# Patient Record
Sex: Female | Born: 1935 | Race: Black or African American | Hispanic: No | Smoking: Never smoker
Health system: Southern US, Community
[De-identification: ages and names within clinical notes are randomized; demographics above are authoritative.]

## PROBLEM LIST (undated history)

## (undated) DIAGNOSIS — N289 Disorder of kidney and ureter, unspecified: Secondary | ICD-10-CM

## (undated) DIAGNOSIS — E119 Type 2 diabetes mellitus without complications: Secondary | ICD-10-CM

## (undated) DIAGNOSIS — G301 Alzheimer's disease with late onset: Secondary | ICD-10-CM

## (undated) DIAGNOSIS — I10 Essential (primary) hypertension: Secondary | ICD-10-CM

## (undated) DIAGNOSIS — F0390 Unspecified dementia without behavioral disturbance: Secondary | ICD-10-CM

## (undated) DIAGNOSIS — M1990 Unspecified osteoarthritis, unspecified site: Secondary | ICD-10-CM

## (undated) DIAGNOSIS — F0281 Dementia in other diseases classified elsewhere with behavioral disturbance: Secondary | ICD-10-CM

---

## 2004-05-09 ENCOUNTER — Ambulatory Visit: Payer: Self-pay | Admitting: *Deleted

## 2005-01-10 ENCOUNTER — Ambulatory Visit: Payer: Self-pay | Admitting: Nurse Practitioner

## 2006-12-25 ENCOUNTER — Ambulatory Visit: Payer: Self-pay | Admitting: Nurse Practitioner

## 2008-04-21 ENCOUNTER — Ambulatory Visit: Payer: Self-pay | Admitting: Nephrology

## 2008-05-21 ENCOUNTER — Ambulatory Visit: Payer: Self-pay

## 2008-12-17 ENCOUNTER — Ambulatory Visit: Payer: Self-pay | Admitting: Gastroenterology

## 2011-01-17 ENCOUNTER — Ambulatory Visit: Payer: Self-pay | Admitting: Family Medicine

## 2011-10-30 ENCOUNTER — Ambulatory Visit: Payer: Self-pay | Admitting: Ophthalmology

## 2011-10-30 LAB — POTASSIUM: Potassium: 4.6 mmol/L (ref 3.5–5.1)

## 2011-11-08 ENCOUNTER — Ambulatory Visit: Payer: Self-pay | Admitting: Ophthalmology

## 2012-12-31 ENCOUNTER — Ambulatory Visit: Payer: Self-pay | Admitting: Internal Medicine

## 2013-11-07 ENCOUNTER — Ambulatory Visit: Payer: Self-pay | Admitting: Physician Assistant

## 2013-12-18 ENCOUNTER — Ambulatory Visit: Payer: Self-pay | Admitting: Emergency Medicine

## 2014-09-13 NOTE — Op Note (Signed)
PATIENT NAME:  Yam, Ambreen L MR#:  [id number] DATE OF BIRTH:  [birth year]  DATE OF PROCEDURE:  11/08/2011  PREOPERATIVE DIAGNOSES:  1. Epiretinal membrane of the left eye.  2. Retinal edema of the left eye.  3. Visually significant cataract of the left eye.   POSTOPERATIVE DIAGNOSES:  1. Epiretinal membrane of the left eye.  2. Retinal tear.  PROCEDURES PERFORMED:  1. Pars plana vitrectomy of the left eye.  2. Internal limiting membrane peel of the left eye.  3. Phacoemulsification and intraocular lens insertion of the left eye.  4. Endolaser of the left eye.   PRIMARY SURGEON: Aron Baba, MD  ANESTHESIA: Retrobulbar block of the left eye with monitored anesthesia care.   COMPLICATIONS: None.   INDICATIONS FOR PROCEDURE: This is a patient who presented to my office with decreasing vision in the left eye. Examination revealed an epiretinal membrane and associated retinal edema. The patient was also noted to have a visually significant cataract. The risks, benefits, and alternatives of the above procedure were discussed and the patient wished to proceed.   DETAILS OF PROCEDURE: After informed consent was obtained, the patient was brought to the operative suite at [facility]. The patient was placed in supine position and was given a small dose of Alfenta and a retrobulbar block was performed on the left eye by the primary surgeon without any complications. The left eye was prepped and draped in sterile manner. After lid speculum was inserted, a side-port wound was created at approximately 10:30 in a clear corneal fashion. DisCoVisc was injected into the anterior chamber to maintain it. A keratome blade was used to create a main corneal wound at approximately 12 o'clock. A cystotome was introduced and a continuous 360 degree anterior capsulorrhexis was created without complications. The lens was hydrodissected using BSS on a 26-gauge cannula. The lens was rotated  for 90 degrees. Phacoemulsification wand was introduced and the lens was broken into four quadrants and removed without complication. The remnant cortical material was removed using INA. DisCoVisc was injected into the capsular bag. A 19-diopter Tecnis ZCB00 lens, serial #[id number] was injected into the capsular bag and rotated into position. The DisCoVisc was removed using INA. The wounds were hydrated and noted to be watertight. Attention was turned to the pars plana vitrectomy portion of the case.   A 25-gauge trocar was placed inferotemporally through displaced conjunctiva in an oblique fashion 3 mm beyond the limbus. The infusion cannula was turned on and inserted through the trocar and secured into position with Steri-Strips. Two more trocars were placed in a similar fashion superotemporally and superonasally. Vitreous cutter and light pipe were introduced in the eye and a core vitrectomy was performed. Vitreous base was confirmed as elevated and the vitreous was trimmed for 360 degrees. Indocyanine green was injected onto the posterior pole and removed within 30 seconds. An epiretinal membrane and internal limiting membrane peel were performed for 360 degrees around the fovea without complication. Scleral depressed exam was then performed and two small breaks were identified at approximately 12 o'clock. Complete air-fluid exchange was performed and endolaser was utilized as a focal wall off to allow four rows of laser around the tears out to the ora serrata. No other breaks or tears could be identified for 360 degrees prior to the air-fluid exchange. The trocars were then removed and the wounds were noted to be airtight. 5 mg of dexamethasone was given into the inferior fornix and the pressure in the eye  was confirmed to be approximately 15 mmHg. 5 mg of dexamethasone was given and the lid speculum was removed. The eye was cleaned and TobraDex was placed in the eye. A patch and shield were placed over the  eye and the patient was transferred to postanesthesia care with instructions to remain head up.  ____________________________ Ignacia FellingMatthew F. Champ MungoAppenzeller, MD mfa:slb D: 11/08/2011 09:52:25 ET    T: 11/08/2011 10:48:08 ET       JOB#: [id number] cc: Ignacia FellingMatthew F. Champ MungoAppenzeller, MD, <Dictator> Cline CoolsMATTHEW F Kirstie Larsen MD ELECTRONICALLY SIGNED 11/15/2011 7:07

## 2015-12-06 ENCOUNTER — Other Ambulatory Visit: Payer: Self-pay | Admitting: Internal Medicine

## 2015-12-06 DIAGNOSIS — Z13820 Encounter for screening for osteoporosis: Secondary | ICD-10-CM

## 2016-07-19 ENCOUNTER — Ambulatory Visit
Admission: RE | Admit: 2016-07-19 | Payer: Medicare Other | Source: Ambulatory Visit | Admitting: Unknown Physician Specialty

## 2016-07-19 ENCOUNTER — Encounter: Admission: RE | Payer: Self-pay | Source: Ambulatory Visit

## 2016-07-19 SURGERY — COLONOSCOPY WITH PROPOFOL
Anesthesia: General

## 2016-10-04 ENCOUNTER — Encounter: Admission: RE | Payer: Self-pay | Source: Ambulatory Visit

## 2016-10-04 ENCOUNTER — Ambulatory Visit
Admission: RE | Admit: 2016-10-04 | Payer: Medicare Other | Source: Ambulatory Visit | Admitting: Unknown Physician Specialty

## 2016-10-04 SURGERY — COLONOSCOPY WITH PROPOFOL
Anesthesia: General

## 2019-05-27 ENCOUNTER — Emergency Department: Payer: Medicare Other

## 2019-05-27 ENCOUNTER — Emergency Department
Admission: EM | Admit: 2019-05-27 | Discharge: 2019-05-27 | Disposition: A | Discharge status: Home / Self Care | Payer: Medicare Other | Attending: Emergency Medicine | Admitting: Emergency Medicine

## 2019-05-27 DIAGNOSIS — S098XXA Other specified injuries of head, initial encounter: Secondary | ICD-10-CM | POA: Insufficient documentation

## 2019-05-27 DIAGNOSIS — Y999 Unspecified external cause status: Secondary | ICD-10-CM | POA: Diagnosis not present

## 2019-05-27 DIAGNOSIS — Y939 Activity, unspecified: Secondary | ICD-10-CM | POA: Diagnosis not present

## 2019-05-27 DIAGNOSIS — G301 Alzheimer's disease with late onset: Secondary | ICD-10-CM | POA: Insufficient documentation

## 2019-05-27 DIAGNOSIS — E119 Type 2 diabetes mellitus without complications: Secondary | ICD-10-CM | POA: Diagnosis not present

## 2019-05-27 DIAGNOSIS — W19XXXA Unspecified fall, initial encounter: Secondary | ICD-10-CM | POA: Insufficient documentation

## 2019-05-27 DIAGNOSIS — S0181XA Laceration without foreign body of other part of head, initial encounter: Secondary | ICD-10-CM | POA: Insufficient documentation

## 2019-05-27 DIAGNOSIS — Y92129 Unspecified place in nursing home as the place of occurrence of the external cause: Secondary | ICD-10-CM | POA: Insufficient documentation

## 2019-05-27 DIAGNOSIS — I10 Essential (primary) hypertension: Secondary | ICD-10-CM | POA: Insufficient documentation

## 2019-05-27 DIAGNOSIS — S0990XA Unspecified injury of head, initial encounter: Secondary | ICD-10-CM

## 2019-05-27 DIAGNOSIS — F0281 Dementia in other diseases classified elsewhere with behavioral disturbance: Secondary | ICD-10-CM | POA: Insufficient documentation

## 2019-05-27 HISTORY — DX: Unspecified dementia, unspecified severity, without behavioral disturbance, psychotic disturbance, mood disturbance, and anxiety: F03.90

## 2019-05-27 HISTORY — DX: Disorder of kidney and ureter, unspecified: N28.9

## 2019-05-27 HISTORY — DX: Type 2 diabetes mellitus without complications: E11.9

## 2019-05-27 HISTORY — DX: Unspecified osteoarthritis, unspecified site: M19.90

## 2019-05-27 HISTORY — DX: Dementia in other diseases classified elsewhere with behavioral disturbance: F02.81

## 2019-05-27 HISTORY — DX: Alzheimer's disease with late onset: G30.1

## 2019-05-27 HISTORY — DX: Essential (primary) hypertension: I10

## 2019-05-27 MED ORDER — LIDOCAINE-EPINEPHRINE-TETRACAINE (LET) TOPICAL GEL
3.0000 mL | Freq: Once | TOPICAL | Status: AC
  Administered 2019-05-27: 08:00:00 3 mL via TOPICAL

## 2019-05-27 MED ORDER — LIDOCAINE HCL (PF) 1 % IJ SOLN
10.0000 mL | Freq: Once | INTRAMUSCULAR | Status: AC
  Administered 2019-05-27: 10 mL
  Filled 2019-05-27: qty 10

## 2019-05-27 NOTE — ED Provider Notes (Signed)
[facility] Emergency Department Provider Note  Time seen: 7:44 AM  I have reviewed the triage vital signs and the nursing notes.   HISTORY  Chief Complaint Fall   HPI Lori Francis is a 84 y.o. female with a past medical history of dementia, diabetes, hypertension presents to the emergency department after a fall.  According to report patient fell at her nursing home this morning, patient has a substantial laceration to her forehead.  Patient does not recall the fall, cannot tell me why she is here but has a reported history of dementia.  Has no complaints, denies any pain.   Past Medical History:  Diagnosis Date  . Alzheimer's dementia, late onset, with behavioral disturbance ([place])   . Dementia ([place])   . Diabetes mellitus without complication ([place])   . Hypertension   . Osteoarthritis   . Renal disorder     There are no problems to display for this patient.   History reviewed. No pertinent surgical history.  Prior to Admission medications   Not on File    Not on File  History reviewed. No pertinent family history.  Social History Social History   Tobacco Use  . Smoking status: Never Smoker  . Smokeless tobacco: Never Used  Substance Use Topics  . Alcohol use: Never  . Drug use: Never    Review of Systems Unable to obtain adequate/accurate review of systems secondary to baseline dementia  ____________________________________________   PHYSICAL EXAM:  VITAL SIGNS: ED Triage Vitals  Enc Vitals Group     BP 05/27/19 0650 (!) 166/77     Pulse Rate 05/27/19 0650 80     Resp 05/27/19 0650 16     Temp 05/27/19 0650 98.5 F (36.9 C)     Temp Source 05/27/19 0650 Oral     SpO2 05/27/19 0647 99 %     Weight 05/27/19 0652 130 lb (59 kg)     Height 05/27/19 0652 5\' 1"  (1.549 m)     Head Circumference --      Peak Flow --      Pain Score 05/27/19 0651 3     Pain Loc --      Pain Edu? --      Excl. in [place]? --     Constitutional: Patient is awake and alert, no distress.  Calm and cooperative. Eyes: Normal exam ENT      Head: Patient has a stellate laceration to the forehead measuring approximately 6 cm in diameter      Mouth/Throat: Mucous membranes are moist. Cardiovascular: Normal rate, regular rhythm.  Respiratory: Normal respiratory effort without tachypnea nor retractions. Breath sounds are clear Gastrointestinal: Soft and nontender. No distention.   Musculoskeletal: Nontender with normal range of motion in Francis extremities Neurologic:  Normal speech and language. No gross focal neurologic deficits  Skin: Stellate forehead laceration Psychiatric: Mood and affect are normal.  ____________________________________________   RADIOLOGY  CT of the head and neck are negative for acute injury.  ____________________________________________   INITIAL IMPRESSION / ASSESSMENT AND PLAN / ED COURSE  Pertinent labs & imaging results that were available during my care of the patient were reviewed by me and considered in my medical decision making (see chart for details).   Patient presents to the emergency department after a fall at her nursing facility with a stellate forehead laceration.  Overall patient appears well, alert, no distress.  Moves Francis extremities well without pain elicited.  We will numb the patient's  forehead laceration and repair with sutures.  We will also obtain CT imaging of the head and C-spine as precaution given the laceration.  Stellate laceration repaired with 7 sutures.  Patient will be discharged back to her nursing facility.  LACERATION REPAIR Performed by: Minna Antis Authorized by: Minna Antis Consent: Verbal consent obtained. Risks and benefits: risks, benefits and alternatives were discussed Consent given by: patient Patient identity confirmed: provided demographic data Prepped and Draped in normal sterile fashion Wound explored  Laceration  Location: Forehead  Laceration Length: 6 cm, stellate  No Foreign Bodies seen or palpated  Anesthesia: local infiltration as well as topical  Local anesthetic: lidocaine 1% without epinephrine  Anesthetic total: 7 ml  Irrigation method: syringe Amount of cleaning: standard  Skin closure: 5-0 nylon  Number of sutures: 7  Technique: Simple interrupted  Patient tolerance: Patient tolerated the procedure well with no immediate complications.   Gwendalynn Eckstrom was evaluated in Emergency Department on 05/27/2019 for the symptoms described in the history of present illness. She was evaluated in the context of the global COVID-19 pandemic, which necessitated consideration that the patient might be at risk for infection with the SARS-CoV-2 virus that causes COVID-19. Institutional protocols and algorithms that pertain to the evaluation of patients at risk for COVID-19 are in a state of rapid change based on information released by regulatory bodies including the CDC and federal and state organizations. These policies and algorithms were followed during the patient's care in the ED.  ____________________________________________   FINAL CLINICAL IMPRESSION(S) / ED DIAGNOSES  Fall Head injury Laceration   Minna Antis, MD 05/27/19 [phone]

## 2019-05-27 NOTE — ED Notes (Signed)
wound on pt forehead cleaned and covered with non adherent gauze and taped down using paper tape.

## 2019-05-27 NOTE — ED Triage Notes (Signed)
Patient is from [facility] facility. Patient arriving by [facility] for unwitnessed fall with a LAC to forehead. Patient denies LOC. Forehead is bandaged with gauze. Bleeding controlled at this time.

## 2019-05-27 NOTE — Discharge Instructions (Addendum)
You have been seen in the emergency department after a fall.  Your work-up including CT scans of the head have resulted normal.  Your laceration was repaired with sutures that will need to be removed in 7 days.  Please follow-up with your doctor in 7 days for suture removal.  Return to the emergency department for any symptoms personally concerning to yourself or staff members.

## 2019-05-27 NOTE — ED Notes (Signed)
[facility]  CALLED  FOR  TRANSPORT 

## 2019-06-03 ENCOUNTER — Other Ambulatory Visit: Payer: Self-pay

## 2019-06-03 ENCOUNTER — Emergency Department: Payer: Medicare Other

## 2019-06-03 ENCOUNTER — Emergency Department
Admission: EM | Admit: 2019-06-03 | Discharge: 2019-06-03 | Disposition: A | Discharge status: Home / Self Care | Payer: Medicare Other | Attending: Emergency Medicine | Admitting: Emergency Medicine

## 2019-06-03 DIAGNOSIS — Y92122 Bedroom in nursing home as the place of occurrence of the external cause: Secondary | ICD-10-CM | POA: Insufficient documentation

## 2019-06-03 DIAGNOSIS — E119 Type 2 diabetes mellitus without complications: Secondary | ICD-10-CM | POA: Insufficient documentation

## 2019-06-03 DIAGNOSIS — N3001 Acute cystitis with hematuria: Secondary | ICD-10-CM

## 2019-06-03 DIAGNOSIS — G309 Alzheimer's disease, unspecified: Secondary | ICD-10-CM | POA: Insufficient documentation

## 2019-06-03 DIAGNOSIS — Z23 Encounter for immunization: Secondary | ICD-10-CM | POA: Insufficient documentation

## 2019-06-03 DIAGNOSIS — Y939 Activity, unspecified: Secondary | ICD-10-CM | POA: Insufficient documentation

## 2019-06-03 DIAGNOSIS — I10 Essential (primary) hypertension: Secondary | ICD-10-CM | POA: Diagnosis not present

## 2019-06-03 DIAGNOSIS — S0990XA Unspecified injury of head, initial encounter: Secondary | ICD-10-CM

## 2019-06-03 DIAGNOSIS — Y998 Other external cause status: Secondary | ICD-10-CM | POA: Insufficient documentation

## 2019-06-03 DIAGNOSIS — W19XXXA Unspecified fall, initial encounter: Secondary | ICD-10-CM

## 2019-06-03 LAB — URINALYSIS, COMPLETE (UACMP) WITH MICROSCOPIC
Bilirubin Urine: NEGATIVE
Glucose, UA: NEGATIVE mg/dL
Hgb urine dipstick: NEGATIVE
Ketones, ur: NEGATIVE mg/dL
Nitrite: POSITIVE — AB
Protein, ur: 30 mg/dL — AB
Specific Gravity, Urine: 1.011 (ref 1.005–1.030)
WBC, UA: >50 WBC/hpf — ABNORMAL HIGH (ref 0–5)
pH: 6 (ref 5.0–8.0)

## 2019-06-03 LAB — CBC WITH DIFFERENTIAL/PLATELET
Abs Immature Granulocytes: 0.03 10*3/uL (ref 0.00–0.07)
Basophils Absolute: 0.1 10*3/uL (ref 0.0–0.1)
Basophils Relative: 1 %
Eosinophils Absolute: 0.3 10*3/uL (ref 0.0–0.5)
Eosinophils Relative: 3 %
HCT: 29.7 % — ABNORMAL LOW (ref 36.0–46.0)
Hemoglobin: 9.6 g/dL — ABNORMAL LOW (ref 12.0–15.0)
Immature Granulocytes: 0 %
Lymphocytes Relative: 32 %
Lymphs Abs: 3 10*3/uL (ref 0.7–4.0)
MCH: 31.3 pg (ref 26.0–34.0)
MCHC: 32.3 g/dL (ref 30.0–36.0)
MCV: 96.7 fL (ref 80.0–100.0)
Monocytes Absolute: 0.8 10*3/uL (ref 0.1–1.0)
Monocytes Relative: 9 %
Neutro Abs: 5.3 10*3/uL (ref 1.7–7.7)
Neutrophils Relative %: 55 %
Platelets: 357 10*3/uL (ref 150–400)
RBC: 3.07 MIL/uL — ABNORMAL LOW (ref 3.87–5.11)
RDW: 14.1 % (ref 11.5–15.5)
WBC: 9.4 10*3/uL (ref 4.0–10.5)
nRBC: 0 % (ref 0.0–0.2)

## 2019-06-03 LAB — COMPREHENSIVE METABOLIC PANEL
ALT: 9 U/L (ref 0–44)
AST: 17 U/L (ref 15–41)
Albumin: 3.4 g/dL — ABNORMAL LOW (ref 3.5–5.0)
Alkaline Phosphatase: 74 U/L (ref 38–126)
Anion gap: 8 (ref 5–15)
BUN: 41 mg/dL — ABNORMAL HIGH (ref 8–23)
CO2: 27 mmol/L (ref 22–32)
Calcium: 9.1 mg/dL (ref 8.9–10.3)
Chloride: 107 mmol/L (ref 98–111)
Creatinine, Ser: 1.83 mg/dL — ABNORMAL HIGH (ref 0.44–1.00)
GFR calc Af Amer: 29 mL/min — ABNORMAL LOW (ref >60)
GFR calc non Af Amer: 25 mL/min — ABNORMAL LOW (ref >60)
Glucose, Bld: 106 mg/dL — ABNORMAL HIGH (ref 70–99)
Potassium: 5.3 mmol/L — ABNORMAL HIGH (ref 3.5–5.1)
Sodium: 142 mmol/L (ref 135–145)
Total Bilirubin: 0.7 mg/dL (ref 0.3–1.2)
Total Protein: 7.2 g/dL (ref 6.5–8.1)

## 2019-06-03 MED ORDER — ACETAMINOPHEN 500 MG PO TABS
1000.0000 mg | ORAL_TABLET | Freq: Once | ORAL | Status: AC
  Administered 2019-06-03: 1000 mg via ORAL
  Filled 2019-06-03: qty 2

## 2019-06-03 MED ORDER — TETANUS-DIPHTH-ACELL PERTUSSIS 5-2.5-18.5 LF-MCG/0.5 IM SUSP
0.5000 mL | Freq: Once | INTRAMUSCULAR | Status: AC
  Administered 2019-06-03: 07:00:00 0.5 mL via INTRAMUSCULAR
  Filled 2019-06-03: qty 0.5

## 2019-06-03 MED ORDER — LACTATED RINGERS IV BOLUS: 1000.0000 mL | Freq: Once | INTRAVENOUS | Status: DC

## 2019-06-03 MED ORDER — SODIUM CHLORIDE 0.9 % IV BOLUS
1000.0000 mL | Freq: Once | INTRAVENOUS | Status: AC
  Administered 2019-06-03: 1000 mL via INTRAVENOUS

## 2019-06-03 MED ORDER — SODIUM CHLORIDE 0.9 % IV SOLN
1.0000 g | Freq: Once | INTRAVENOUS | Status: AC
  Administered 2019-06-03: 1 g via INTRAVENOUS
  Filled 2019-06-03: qty 10

## 2019-06-03 MED ORDER — CEPHALEXIN 500 MG PO CAPS: 500.0000 mg | ORAL_CAPSULE | Freq: Three times a day (TID) | ORAL | 0 refills | Status: AC

## 2019-06-03 NOTE — ED Notes (Signed)
Pt unable to sign r/t dementia. °

## 2019-06-03 NOTE — ED Provider Notes (Signed)
-----------------------------------------   6:59 AM on 06/03/2019 -----------------------------------------  Blood pressure 110/70, pulse 65, temperature 98.4 F (36.9 C), temperature source Oral, resp. rate 18, height 5\' 1"  (1.549 m), weight 58.9 kg, SpO2 98 %.  Assuming care from Dr. .  In short, Lori Francis is a 84 y.o. female with a chief complaint of Fall and Laceration (forehead) .  Refer to the original H&P for additional details.  The current plan of care is to follow-up labs and UA, plan to d/c back to facility with treatment for UTI.  BMP shows chronic kidney disease comparable to patient's baseline per prior lab work from [facility].  She does appear to be slightly dehydrated and was hydrated with IV fluids, also completed dose of IV antibiotics without issue.  Vital signs are not consistent with sepsis and patient remains appropriate for discharge back to nursing facility per original plan.    [facility], MD 06/03/19 [phone]

## 2019-06-03 NOTE — ED Provider Notes (Signed)
[facility] Emergency Department Provider Note  ____________________________________________  Time seen: Approximately 5:10 AM  I have reviewed the triage vital signs and the nursing notes.   HISTORY  Chief Complaint Fall and Laceration (forehead)   HPI Lori Francis is a 84 y.o. female  with a history of Alzheimer's dementia, diabetes, hypertension, osteoarthritis  who presents for evaluation of a unwitnessed fall.  Patient arrives from her nursing home after being on the floor in her room.  Patient was at baseline, alert, and aware of having fallen.  She is complaining of mild headache.  Patient has a laceration that was repaired from a fall 7 days ago and her forehead and he seems like some of the stitches have come lose.  She has mild bleeding from the site.  She is not on blood thinners.  Patient denies neck pain or back pain, hip pain, extremity pain, chest pain, abdominal pain    Past Medical History:  Diagnosis Date  . Alzheimer's dementia, late onset, with behavioral disturbance ([facility])   . Dementia ([facility])   . Diabetes mellitus without complication ([facility])   . Hypertension   . Osteoarthritis   . Renal disorder     There are no problems to display for this patient.   History reviewed. No pertinent surgical history.  Prior to Admission medications   Not on File    Allergies Patient has no allergy information on record.  History reviewed. No pertinent family history.  Social History Social History   Tobacco Use  . Smoking status: Never Smoker  . Smokeless tobacco: Never Used  Substance Use Topics  . Alcohol use: Never  . Drug use: Never    Review of Systems  Constitutional: Negative for fever. Eyes: Negative for visual changes. ENT: Negative for facial injury or neck injury Cardiovascular: Negative for chest injury. Respiratory: Negative for shortness of breath. Negative for chest wall injury. Gastrointestinal: Negative  for abdominal pain or injury. Genitourinary: Negative for dysuria. Musculoskeletal: Negative for back injury, negative for arm or leg pain. Skin: Negative for laceration/abrasions. Neurological: + head injury.   ____________________________________________   PHYSICAL EXAM:  VITAL SIGNS: Vitals:   06/03/19 0510  BP: 110/70  Pulse: 65  Resp: 18  Temp: 98.4 F (36.9 C)  SpO2: 98%    Full spinal precautions maintained throughout the trauma exam. Constitutional: Alert and oriented x 2. No acute distress. Does not appear intoxicated. HEENT Head: Normocephalic and atraumatic. Face: No facial bony tenderness. Stable midface. Previous forehead laceration with small amount of blood Ears: No hemotympanum bilaterally. No Battle sign Eyes: No eye injury. PERRL. No raccoon eyes Nose: Nontender. No epistaxis. No rhinorrhea Mouth/Throat: Mucous membranes are moist. No oropharyngeal blood. No dental injury. Airway patent without stridor. Normal voice. Neck: no C-collar. No midline c-spine tenderness.  Cardiovascular: Normal rate, regular rhythm. Normal and symmetric distal pulses are present in all extremities. Pulmonary/Chest: Chest wall is stable and nontender to palpation/compression. Normal respiratory effort. Breath sounds are normal. No crepitus.  Abdominal: Soft, nontender, non distended. Musculoskeletal: Nontender with normal full range of motion in all extremities. No deformities. No thoracic or lumbar midline spinal tenderness. Pelvis is stable. Skin: Skin is warm, dry and intact. No abrasions or contutions. Psychiatric: Speech and behavior are appropriate. Neurological: Normal speech and language. Moves all extremities to command. No gross focal neurologic deficits are appreciated.  Glascow Coma Score: 4 - Opens eyes on own 6 - Follows simple motor commands 5 - Alert and  oriented GCS: 15   ____________________________________________   LABS (all labs ordered are listed,  but only abnormal results are displayed)  Labs Reviewed  URINALYSIS, COMPLETE (UACMP) WITH MICROSCOPIC  CBC WITH DIFFERENTIAL/PLATELET  COMPREHENSIVE METABOLIC PANEL   ____________________________________________  EKG  ED ECG REPORT I, Nita Sickle, the attending physician, personally viewed and interpreted this ECG.  Normal sinus rhythm, rate of 71, normal intervals, normal axis, no ST elevations or depressions, T wave inversions in lateral leads.  New when compared to prior from 2013 ____________________________________________  RADIOLOGY  I have personally reviewed the images performed during this visit and I agree with the Radiologist's read.   Interpretation by Radiologist:  CT Head Wo Contrast  Result Date: 06/03/2019 CLINICAL DATA:  Unwitnessed fall with forehead laceration EXAM: CT HEAD WITHOUT CONTRAST CT CERVICAL SPINE WITHOUT CONTRAST TECHNIQUE: Multidetector CT imaging of the head and cervical spine was performed following the standard protocol without intravenous contrast. Multiplanar CT image reconstructions of the cervical spine were also generated. COMPARISON:  05/27/2019 FINDINGS: CT HEAD FINDINGS Brain: No evidence of acute infarction, hemorrhage, hydrocephalus, extra-axial collection or mass lesion/mass effect. Generalized atrophy. Moderate remote right occipital infarct. Vascular: No hyperdense vessel or unexpected calcification. Skull: Forehead hematoma without calvarial fracture Sinuses/Orbits: No evidence of injury. Left cataract resection and chronic appearing sphenoid sinusitis with greater opacification on the left. CT CERVICAL SPINE FINDINGS Alignment: No traumatic malalignment. Skull base and vertebrae: Negative for acute fracture. C3-4 ACDF with solid arthrodesis Soft tissues and spinal canal: No prevertebral fluid or swelling. No visible canal hematoma. Disc levels: Generalized degenerative disease with notable atlantooccipital degeneration with posterior  subluxation. Upper chest: Negative IMPRESSION: 1. No evidence of acute intracranial or cervical spine injury. 2. Forehead hematoma without calvarial fracture. 3. Chronic and senescent changes described above. Electronically Signed   By: Marnee Spring M.D.   On: 06/03/2019 05:31   CT Cervical Spine Wo Contrast  Result Date: 06/03/2019 CLINICAL DATA:  Unwitnessed fall with forehead laceration EXAM: CT HEAD WITHOUT CONTRAST CT CERVICAL SPINE WITHOUT CONTRAST TECHNIQUE: Multidetector CT imaging of the head and cervical spine was performed following the standard protocol without intravenous contrast. Multiplanar CT image reconstructions of the cervical spine were also generated. COMPARISON:  05/27/2019 FINDINGS: CT HEAD FINDINGS Brain: No evidence of acute infarction, hemorrhage, hydrocephalus, extra-axial collection or mass lesion/mass effect. Generalized atrophy. Moderate remote right occipital infarct. Vascular: No hyperdense vessel or unexpected calcification. Skull: Forehead hematoma without calvarial fracture Sinuses/Orbits: No evidence of injury. Left cataract resection and chronic appearing sphenoid sinusitis with greater opacification on the left. CT CERVICAL SPINE FINDINGS Alignment: No traumatic malalignment. Skull base and vertebrae: Negative for acute fracture. C3-4 ACDF with solid arthrodesis Soft tissues and spinal canal: No prevertebral fluid or swelling. No visible canal hematoma. Disc levels: Generalized degenerative disease with notable atlantooccipital degeneration with posterior subluxation. Upper chest: Negative IMPRESSION: 1. No evidence of acute intracranial or cervical spine injury. 2. Forehead hematoma without calvarial fracture. 3. Chronic and senescent changes described above. Electronically Signed   By: Marnee Spring M.D.   On: 06/03/2019 05:31      ____________________________________________   PROCEDURES  Procedure(s) performed: None Procedures Critical Care performed:   None ____________________________________________   INITIAL IMPRESSION / ASSESSMENT AND PLAN / ED COURSE   84 y.o. female  with a history of Alzheimer's dementia, diabetes, hypertension, osteoarthritis  who presents for evaluation of a unwitnessed fall.  Patient reinjuring her forehead which has stitches from prior visit for a forehead laceration.  She has small amount of bleeding coming from between 2 stitches.  No nasal laceration requiring more stitches at this time.  Patient is otherwise at baseline with a GCS of 15, alert and oriented x2.  No other injuries based on history and physical exam.  CT head and neck are pending.  Since the fall was unwitnessed we will get labs, EKG, urinalysis to rule out dysrhythmia, anemia, dehydration, electrolyte derangements, UTI as possible causes of patient's fall.      _________________________ 6:34 AM on 06/03/2019 -----------------------------------------  CT is negative for any acute injury.  Labs are pending.   _________________________ 7:00 AM on 06/03/2019 ----------------------------------------- Labs and UA pending. Care transferred to Dr. Charna Archer at [facility]    Please note:  Patient was evaluated in Emergency Department today for the symptoms described in the history of present illness. Patient was evaluated in the context of the global COVID-19 pandemic, which necessitated consideration that the patient might be at risk for infection with the SARS-CoV-2 virus that causes COVID-19. Institutional protocols and algorithms that pertain to the evaluation of patients at risk for COVID-19 are in a state of rapid change based on information released by regulatory bodies including the CDC and federal and state organizations. These policies and algorithms were followed during the patient's care in the ED.  Some ED evaluations and interventions may be delayed as a result of limited staffing during the pandemic.   As part of my medical decision making, I  reviewed the following data within the [place] notes reviewed and incorporated, EKG interpreted , Old chart reviewed, Radiograph reviewed , Notes from prior ED visits and [place] Controlled Substance Database   ____________________________________________   FINAL CLINICAL IMPRESSION(S) / ED DIAGNOSES   Final diagnoses:  Fall, initial encounter  Injury of head, initial encounter      NEW MEDICATIONS STARTED DURING THIS VISIT:  ED Discharge Orders    None       Note:  This document was prepared using Dragon voice recognition software and may include unintentional dictation errors.    Alfred Levins, [place], MD 06/03/19 [phone]

## 2019-06-03 NOTE — Discharge Instructions (Addendum)

## 2019-06-03 NOTE — ED Triage Notes (Signed)
Pt arrives to ED from [facility] via [facility] with c/c of unwitnessed fall and laceration to left side forehead. Pt with Hx of alzheimers. Pt found by staff alert and aware. Wound dressed by staff. EMS reports pt alert and oriented to self, bleeding controlled. Transport vitals: 144/58, p59, r18, O2 97% on room air. Upon arrival pt alert and oriented to self, situation, NAD, Dr Don Perking at bedside.

## 2019-06-03 NOTE — ED Notes (Signed)
Spoke with daughter darlene and her husband will pick pt up and take back to [facility] so pt does not have to go EMS>.

## 2019-06-05 LAB — URINE CULTURE: Culture: >=100000 — AB

## 2019-06-07 NOTE — Progress Notes (Signed)
Brief Pharmacy Note  Patient is an 84 y/o F with a medical history of Alzheimer's dementia, diabetes, hypertension, osteoarthritis who presented to [facility] ED 1/12 from [facility] [facility] after un-witnessed fall and laceration to forehead. UA concerning for UTI and patient was discharged on cephalexin. Urine culture from ED visit has resulted >100k colonies/mL Enterobacter aerogenes (cefazolin resistant). Discussed result with EDP and lack of activity of discharge activity versus identified organism. Per discussion, plan to alter antibiotic regimen to ciprofloxacin.  [employer] and spoke with patient's caregiver and provided result of urine culture over the phone. She stated she would pass along culture results to facility physician. Faxed culture report to number provided. Will defer antibiotic prescribing to facility physician.  Dorothea Ogle Pharmacy Resident 07 June 2019

## 2019-12-06 ENCOUNTER — Other Ambulatory Visit: Payer: Self-pay

## 2019-12-06 ENCOUNTER — Emergency Department: Payer: Medicare Other

## 2019-12-06 ENCOUNTER — Emergency Department
Admission: EM | Admit: 2019-12-06 | Discharge: 2019-12-06 | Disposition: A | Discharge status: Skilled Nursing Facility | Payer: Medicare Other | Attending: Emergency Medicine | Admitting: Emergency Medicine

## 2019-12-06 ENCOUNTER — Encounter: Payer: Self-pay | Admitting: Emergency Medicine

## 2019-12-06 DIAGNOSIS — F0390 Unspecified dementia without behavioral disturbance: Secondary | ICD-10-CM | POA: Insufficient documentation

## 2019-12-06 DIAGNOSIS — W01198A Fall on same level from slipping, tripping and stumbling with subsequent striking against other object, initial encounter: Secondary | ICD-10-CM | POA: Diagnosis not present

## 2019-12-06 DIAGNOSIS — I10 Essential (primary) hypertension: Secondary | ICD-10-CM | POA: Diagnosis not present

## 2019-12-06 DIAGNOSIS — Y9389 Activity, other specified: Secondary | ICD-10-CM | POA: Insufficient documentation

## 2019-12-06 DIAGNOSIS — Y929 Unspecified place or not applicable: Secondary | ICD-10-CM | POA: Diagnosis not present

## 2019-12-06 DIAGNOSIS — S0181XA Laceration without foreign body of other part of head, initial encounter: Secondary | ICD-10-CM | POA: Diagnosis not present

## 2019-12-06 DIAGNOSIS — S0990XA Unspecified injury of head, initial encounter: Secondary | ICD-10-CM | POA: Diagnosis present

## 2019-12-06 DIAGNOSIS — E119 Type 2 diabetes mellitus without complications: Secondary | ICD-10-CM | POA: Diagnosis not present

## 2019-12-06 DIAGNOSIS — W19XXXA Unspecified fall, initial encounter: Secondary | ICD-10-CM

## 2019-12-06 DIAGNOSIS — Y998 Other external cause status: Secondary | ICD-10-CM | POA: Insufficient documentation

## 2019-12-06 MED ORDER — LIDOCAINE-EPINEPHRINE (PF) 2 %-1:200000 IJ SOLN
10.0000 mL | Freq: Once | INTRAMUSCULAR | Status: AC
  Administered 2019-12-06: 10 mL via INTRADERMAL
  Filled 2019-12-06: qty 10

## 2019-12-06 MED ORDER — BACITRACIN ZINC 500 UNIT/GM EX OINT: TOPICAL_OINTMENT | CUTANEOUS | 0 refills | Status: AC

## 2019-12-06 MED ORDER — BACITRACIN ZINC 500 UNIT/GM EX OINT
TOPICAL_OINTMENT | Freq: Once | CUTANEOUS | Status: AC
  Administered 2019-12-06: 1 via TOPICAL
  Filled 2019-12-06: qty 0.9

## 2019-12-06 MED ORDER — LIDOCAINE-EPINEPHRINE 2 %-1:100000 IJ SOLN
20.0000 mL | Freq: Once | INTRAMUSCULAR | Status: DC
  Filled 2019-12-06: qty 20

## 2019-12-06 MED ORDER — LIDOCAINE HCL (PF) 1 % IJ SOLN
INTRAMUSCULAR | Status: AC
  Filled 2019-12-06: qty 5

## 2019-12-06 NOTE — ED Notes (Signed)
Wound covered with antibiotic cream and dressing

## 2019-12-06 NOTE — ED Notes (Signed)
md in to suture pt at [phone]

## 2019-12-06 NOTE — ED Triage Notes (Signed)
Pt via EMS from [facility]. Pt had a witnessed mechanical fall. Staff states that she fell forward out of her wheelchair. Pt has a hx of dementia. Pt is A&O to self. Pt has laceration the R side of her forehead.

## 2019-12-06 NOTE — ED Notes (Signed)
Pt taken to CT.

## 2019-12-06 NOTE — Discharge Instructions (Signed)
APPLY ANTIBIOTIC OINTMENT TO THE LACERATION TWICE DAILY for 7 DAYS  The sutures should dissolve with time.

## 2019-12-06 NOTE — ED Notes (Signed)
Pt to be transported back to facility via [facility].

## 2019-12-06 NOTE — ED Notes (Signed)
[facility] here to pick pt up.  

## 2019-12-06 NOTE — ED Notes (Signed)
Pt intermittently yelling out, confused, remains in bed. resps unlabored.

## 2019-12-06 NOTE — ED Provider Notes (Signed)
[facility] Emergency Department Provider Note  ____________________________________________   None    (approximate)  I have reviewed the triage vital signs and the nursing notes.   HISTORY  Chief Complaint Fall    HPI Lori Francis is a 84 y.o. female with past medical history of dementia here with fall.  Patient reportedly  was witnessed to lean forward, striking her head.  There is no loss conscious.  No seizure-like activity.  Patient has been at her baseline mental status since the episode.  She has severe dementia.  Level 5 caveat invoked as remainder of history, ROS, and physical exam limited due to patient's dementia.        Past Medical History:  Diagnosis Date  . Alzheimer's dementia, late onset, with behavioral disturbance (HCC)   . Dementia (HCC)   . Diabetes mellitus without complication (HCC)   . Hypertension   . Osteoarthritis   . Renal disorder     There are no problems to display for this patient.   History reviewed. No pertinent surgical history.  Prior to Admission medications   Medication Sig Start Date End Date Taking? Authorizing Provider  bacitracin ointment Apply to affected area twice daily 12/06/19 12/05/20  Shaune PollackIsaacs, Koji Niehoff, MD    Allergies Patient has no known allergies.  History reviewed. No pertinent family history.  Social History Social History   Tobacco Use  . Smoking status: Never Smoker  . Smokeless tobacco: Never Used  Substance Use Topics  . Alcohol use: Never  . Drug use: Never    Review of Systems  Review of Systems  Unable to perform ROS: Dementia     ____________________________________________  PHYSICAL EXAM:      VITAL SIGNS: ED Triage Vitals  Enc Vitals Group     BP 12/06/19 1810 (!) 149/75     Pulse Rate 12/06/19 1806 98     Resp 12/06/19 1806 18     Temp 12/06/19 1806 (!) 97.4 F (36.3 C)     Temp Source 12/06/19 1806 Axillary     SpO2 12/06/19 1806 100 %      Weight 12/06/19 1807 127 lb 11.2 oz (57.9 kg)     Height 12/06/19 1807 5' (1.524 m)     Head Circumference --      Peak Flow --      Pain Score --      Pain Loc --      Pain Edu? --      Excl. in GC? --      Physical Exam Vitals and nursing note reviewed.  Constitutional:      General: She is not in acute distress.    Appearance: She is well-developed.  HENT:     Head: Normocephalic and atraumatic.     Comments: Stellate, irregular laceration left forehead with associated edema. Cerumen b/l EACs but no significant impaction noted. No tenderness. Eyes:     Conjunctiva/sclera: Conjunctivae normal.  Cardiovascular:     Rate and Rhythm: Normal rate and regular rhythm.     Heart sounds: Normal heart sounds. No murmur heard.  No friction rub.  Pulmonary:     Effort: Pulmonary effort is normal. No respiratory distress.     Breath sounds: Normal breath sounds. No wheezing or rales.  Abdominal:     General: There is no distension.     Palpations: Abdomen is soft.     Tenderness: There is no abdominal tenderness.  Musculoskeletal:     Cervical  back: Neck supple.  Skin:    General: Skin is warm.     Capillary Refill: Capillary refill takes less than 2 seconds.     Findings: No rash.  Neurological:     Mental Status: She is alert and oriented to person, place, and time. Mental status is at baseline. She is confused.     Motor: No abnormal muscle tone.       ____________________________________________   LABS (all labs ordered are listed, but only abnormal results are displayed)  Labs Reviewed - No data to display  ____________________________________________  EKG: None ________________________________________  RADIOLOGY All imaging, including plain films, CT scans, and ultrasounds, independently reviewed by me, and interpretations confirmed via formal radiology reads.  ED MD interpretation:   CT Head: NAICA CT C Spine: Degen changes, no fx  Official radiology  report(s): CT Head Wo Contrast  Result Date: 12/06/2019 CLINICAL DATA:  Witnessed mechanical fall EXAM: CT HEAD WITHOUT CONTRAST CT CERVICAL SPINE WITHOUT CONTRAST TECHNIQUE: Multidetector CT imaging of the head and cervical spine was performed following the standard protocol without intravenous contrast. Multiplanar CT image reconstructions of the cervical spine were also generated. COMPARISON:  CT 06/03/2019 FINDINGS: CT HEAD FINDINGS Brain: No evidence of acute infarction, hemorrhage, hydrocephalus, extra-axial collection or mass lesion/mass effect. Symmetric prominence of the ventricles, cisterns and sulci compatible with parenchymal volume loss. Patchy areas of Maj matter hypoattenuation are most compatible with chronic microvascular angiopathy. Vascular: Atherosclerotic calcification of the carotid siphons. No hyperdense vessel. Skull: Right frontal scalp thickening and swelling with overlying laceration (3/32) without subjacent calvarial fracture or other acute osseous injury. No suspicious osseous lesions. Normal bone mineralization. Sinuses/Orbits: Diffuse mild sphenoid sinus mural thickening with some pneumatized secretions in the left sphenoid sinus. Mastoid air cells are clear. Prior left lens extraction. Included orbital structures are otherwise unremarkable. Other: Extensive debris in the bilateral external auditory canals, left greater than right. Mild bilateral TMJ arthrosis. CT CERVICAL SPINE FINDINGS Alignment: There is reversal the normal cervical lordosis with an exaggerated upper thoracic kyphotic curvature best seen on scout imaging, progressive from comparison imaging. Prior C3-4 anterior cervical spinal fusion and interbody cage placement. Mild leftward cranial rotation. No evidence of traumatic listhesis. No abnormally widened, perched or jumped facets. Normal alignment of the craniocervical and atlantoaxial articulations accounting for rotation. Skull base and vertebrae: C3-4 anterior  spinal fusion without evidence of acute hardware complication this time. Anterior wedging at the T2 vertebral body and superior endplate of the T3 vertebral body, appear chronic and likely contributing to the upper thoracic kyphosis but incompletely assessed on this examination. No acute skull base or vertebral fracture. No new vertebral body height loss. The osseous structures appear diffusely demineralized which may limit detection of small or nondisplaced fractures. No worrisome osseous lesions are evident. Soft tissues and spinal canal: No pre or paravertebral fluid or swelling. No visible canal hematoma. Disc levels: Multilevel intervertebral disc height loss with spondylitic endplate changes. No severe canal stenosis is evident. At most mild canal stenosis at the fused C3-4 level secondary to posterior ridging across the fused disc space. Some multilevel uncinate spurring and facet hypertrophic changes result in mild to moderate foraminal narrowing. Some calcification of the ligamentum flavum at the C6-7 level is similar to prior. Upper chest: No acute abnormality in the upper chest or imaged lung apices. Remote appearing posttraumatic changes of the distal clavicles bilaterally. Extensive calcification to the proximal great vessels. Other: Postsurgical changes in the right neck may be related to  a prior endarterectomy. No concerning thyroid nodules. IMPRESSION: 1. Right frontal scalp thickening and swelling with overlying laceration without subjacent calvarial fracture or other acute osseous injury. 2. No acute intracranial abnormality. 3. Chronic microvascular angiopathy and parenchymal volume loss. 4. Extensive debris in the bilateral external auditory canals, left greater than right, consistent with cerumen impaction. 5. No acute cervical spine fracture or traumatic listhesis. 6. Prior C3-4 anterior cervical spinal fusion and interbody cage placement. No evidence of acute hardware complication this time.  7. Anterior wedging at the T2 vertebral body and superior endplate of the T3 vertebral body, appear chronic and likely contributing to the upper thoracic kyphosis but incompletely assessed on this examination. Electronically Signed   By: Kreg Shropshire M.D.   On: 12/06/2019 19:08   CT Cervical Spine Wo Contrast  Result Date: 12/06/2019 CLINICAL DATA:  Witnessed mechanical fall EXAM: CT HEAD WITHOUT CONTRAST CT CERVICAL SPINE WITHOUT CONTRAST TECHNIQUE: Multidetector CT imaging of the head and cervical spine was performed following the standard protocol without intravenous contrast. Multiplanar CT image reconstructions of the cervical spine were also generated. COMPARISON:  CT 06/03/2019 FINDINGS: CT HEAD FINDINGS Brain: No evidence of acute infarction, hemorrhage, hydrocephalus, extra-axial collection or mass lesion/mass effect. Symmetric prominence of the ventricles, cisterns and sulci compatible with parenchymal volume loss. Patchy areas of Radu matter hypoattenuation are most compatible with chronic microvascular angiopathy. Vascular: Atherosclerotic calcification of the carotid siphons. No hyperdense vessel. Skull: Right frontal scalp thickening and swelling with overlying laceration (3/32) without subjacent calvarial fracture or other acute osseous injury. No suspicious osseous lesions. Normal bone mineralization. Sinuses/Orbits: Diffuse mild sphenoid sinus mural thickening with some pneumatized secretions in the left sphenoid sinus. Mastoid air cells are clear. Prior left lens extraction. Included orbital structures are otherwise unremarkable. Other: Extensive debris in the bilateral external auditory canals, left greater than right. Mild bilateral TMJ arthrosis. CT CERVICAL SPINE FINDINGS Alignment: There is reversal the normal cervical lordosis with an exaggerated upper thoracic kyphotic curvature best seen on scout imaging, progressive from comparison imaging. Prior C3-4 anterior cervical spinal fusion  and interbody cage placement. Mild leftward cranial rotation. No evidence of traumatic listhesis. No abnormally widened, perched or jumped facets. Normal alignment of the craniocervical and atlantoaxial articulations accounting for rotation. Skull base and vertebrae: C3-4 anterior spinal fusion without evidence of acute hardware complication this time. Anterior wedging at the T2 vertebral body and superior endplate of the T3 vertebral body, appear chronic and likely contributing to the upper thoracic kyphosis but incompletely assessed on this examination. No acute skull base or vertebral fracture. No new vertebral body height loss. The osseous structures appear diffusely demineralized which may limit detection of small or nondisplaced fractures. No worrisome osseous lesions are evident. Soft tissues and spinal canal: No pre or paravertebral fluid or swelling. No visible canal hematoma. Disc levels: Multilevel intervertebral disc height loss with spondylitic endplate changes. No severe canal stenosis is evident. At most mild canal stenosis at the fused C3-4 level secondary to posterior ridging across the fused disc space. Some multilevel uncinate spurring and facet hypertrophic changes result in mild to moderate foraminal narrowing. Some calcification of the ligamentum flavum at the C6-7 level is similar to prior. Upper chest: No acute abnormality in the upper chest or imaged lung apices. Remote appearing posttraumatic changes of the distal clavicles bilaterally. Extensive calcification to the proximal great vessels. Other: Postsurgical changes in the right neck may be related to a prior endarterectomy. No concerning thyroid nodules. IMPRESSION: 1. Right  frontal scalp thickening and swelling with overlying laceration without subjacent calvarial fracture or other acute osseous injury. 2. No acute intracranial abnormality. 3. Chronic microvascular angiopathy and parenchymal volume loss. 4. Extensive debris in the  bilateral external auditory canals, left greater than right, consistent with cerumen impaction. 5. No acute cervical spine fracture or traumatic listhesis. 6. Prior C3-4 anterior cervical spinal fusion and interbody cage placement. No evidence of acute hardware complication this time. 7. Anterior wedging at the T2 vertebral body and superior endplate of the T3 vertebral body, appear chronic and likely contributing to the upper thoracic kyphosis but incompletely assessed on this examination. Electronically Signed   By: Kreg Shropshire M.D.   On: 12/06/2019 19:08    ____________________________________________  PROCEDURES   Procedure(s) performed (including Critical Care):  Marland KitchenMarland KitchenLaceration Repair  Date/Time: 12/06/2019 11:19 PM Performed by: Shaune Pollack, MD Authorized by: Shaune Pollack, MD   Consent:    Consent obtained:  Verbal   Consent given by:  Patient   Risks discussed:  Infection, need for additional repair, pain, tendon damage, retained foreign body, vascular damage, poor cosmetic result, poor wound healing and nerve damage   Alternatives discussed:  Referral and delayed treatment Anesthesia (see MAR for exact dosages):    Anesthesia method:  Local infiltration   Local anesthetic:  Lidocaine 1% WITH epi Laceration details:    Location:  Face   Face location:  Forehead   Length (cm):  5 Repair type:    Repair type:  Intermediate Pre-procedure details:    Preparation:  Patient was prepped and draped in usual sterile fashion and imaging obtained to evaluate for foreign bodies Exploration:    Hemostasis achieved with:  Direct pressure   Wound exploration: wound explored through full range of motion and entire depth of wound probed and visualized     Wound extent: no underlying fracture noted   Treatment:    Area cleansed with:  Betadine   Amount of cleaning:  Extensive   Irrigation solution:  Sterile water   Irrigation method:  Pressure wash Skin repair:    Repair method:   Sutures   Suture size:  5-0   Wound skin closure material used: Vicryl Rapide.   Number of sutures:  20 Approximation:    Approximation:  Close Post-procedure details:    Dressing:  Antibiotic ointment, non-adherent dressing and sterile dressing   Patient tolerance of procedure:  Tolerated well, no immediate complications    ____________________________________________  INITIAL IMPRESSION / MDM / ASSESSMENT AND PLAN / ED COURSE  As part of my medical decision making, I reviewed the following data within the electronic MEDICAL RECORD NUMBER Nursing notes reviewed and incorporated, Old chart reviewed, Notes from prior ED visits, and [place] Controlled Substance Database       *Lori Francis was evaluated in Emergency Department on 12/06/2019 for the symptoms described in the history of present illness. She was evaluated in the context of the global COVID-19 pandemic, which necessitated consideration that the patient might be at risk for infection with the SARS-CoV-2 virus that causes COVID-19. Institutional protocols and algorithms that pertain to the evaluation of patients at risk for COVID-19 are in a state of rapid change based on information released by regulatory bodies including the CDC and federal and state organizations. These policies and algorithms were followed during the patient's care in the ED.  Some ED evaluations and interventions may be delayed as a result of limited staffing during the pandemic.*     Medical  Decision Making: 84 year old female here with laceration to the forehead after witnessed fall.  CT imaging negative for acute abnormality.  She is at her mental baseline.  Tetanus up-to-date.  The wound was thoroughly cleansed and edges were sharply excised until viable tissue was noted.  There was mild amount of tissue deficit, but the wound was able to be approximated.  Dressed in bacitracin.  ____________________________________________  FINAL CLINICAL IMPRESSION(S) /  ED DIAGNOSES  Final diagnoses:  Fall, initial encounter  Facial laceration, initial encounter     MEDICATIONS GIVEN DURING THIS VISIT:  Medications  lidocaine (PF) (XYLOCAINE) 1 % injection (has no administration in time range)  lidocaine-EPINEPHrine (XYLOCAINE W/EPI) 2 %-1:200000 (PF) injection 10 mL (10 mLs Intradermal Given by Other 12/06/19 2044)  bacitracin ointment (1 application Topical Given 12/06/19 2108)     ED Discharge Orders         Ordered    bacitracin ointment     Discontinue  Reprint     12/06/19 2123           Note:  This document was prepared using Dragon voice recognition software and may include unintentional dictation errors.   Shaune Pollack, MD 12/06/19 [phone]

## 2020-06-22 DEATH — deceased

## 2020-10-14 IMAGING — CT CT CERVICAL SPINE W/O CM
2 of 3 series · 13 of 27 positions shown, 15 images · non-contrast
Comparison: 05/27/2019

CLINICAL DATA: Unwitnessed fall with forehead laceration

EXAM:
CT HEAD WITHOUT CONTRAST
CT CERVICAL SPINE WITHOUT CONTRAST
TECHNIQUE: Multidetector CT imaging of the head and cervical spine was
performed following the standard protocol without intravenous
contrast. Multiplanar CT image reconstructions of the cervical spine
were also generated.

[Series 3: c spine soft · axial · 0.41mm/px · z∈[-214,-108]mm · 8 of 63 slices shown, 10 images]
[im 5/63  soft-tissue]
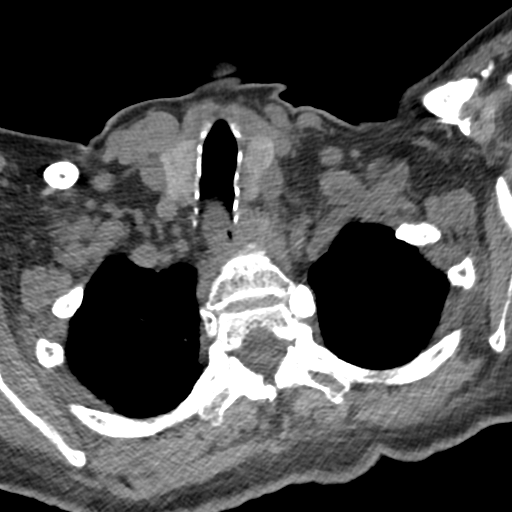
[im 5/63  bone]
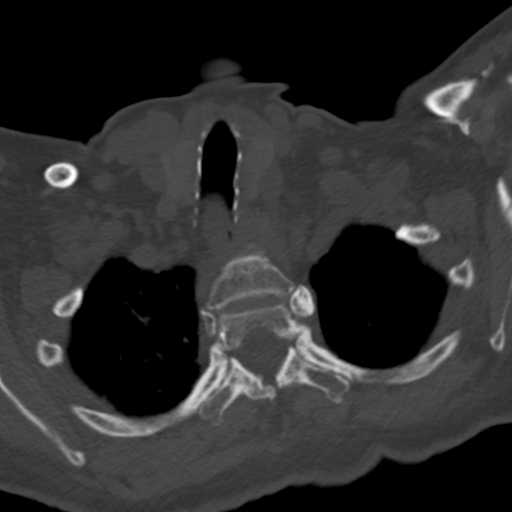
[im 15/63  bone]
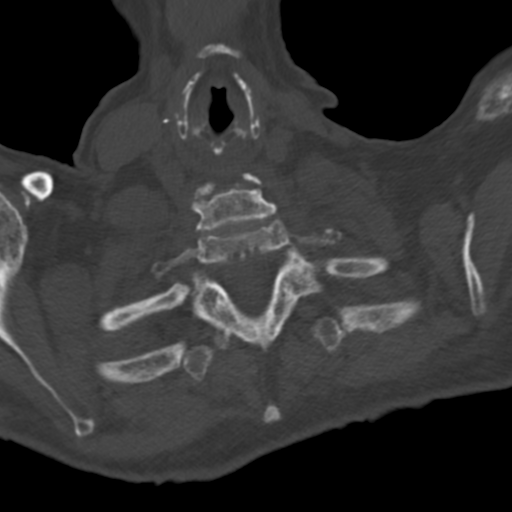
[im 20/63  bone]
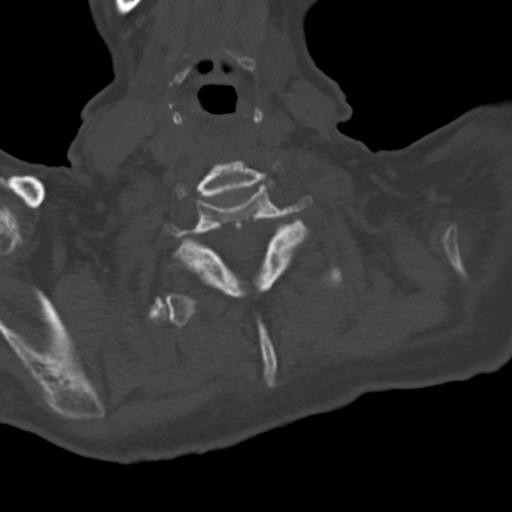
[im 29/63  bone]
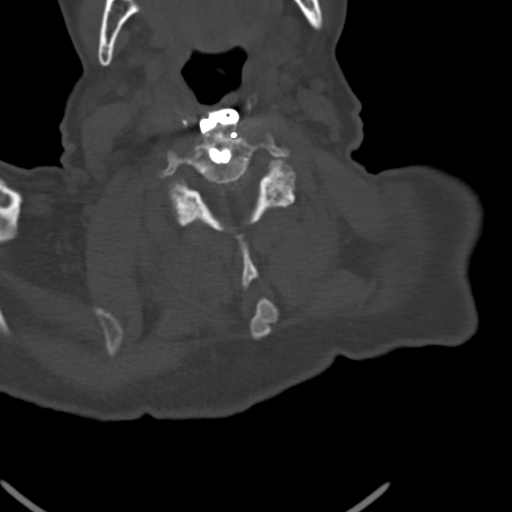
[im 34/63  soft-tissue]
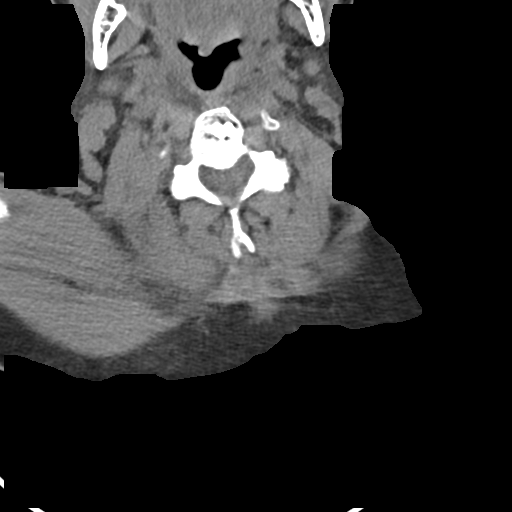
[im 34/63  bone]
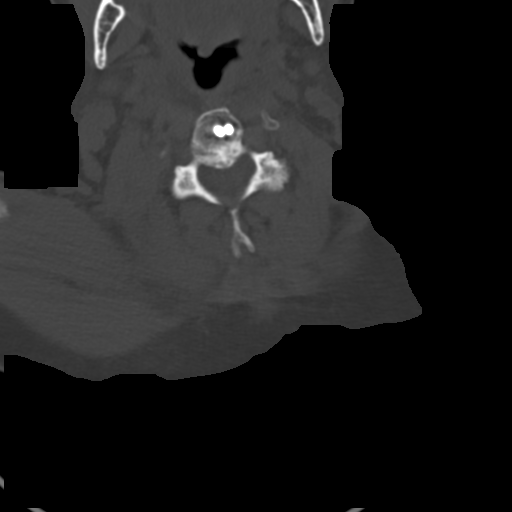
[im 43/63  bone]
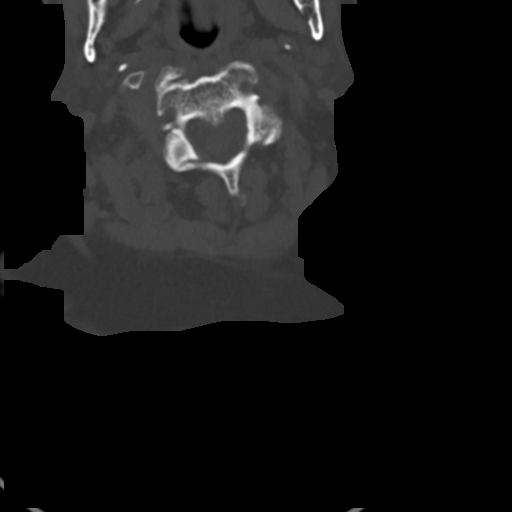
[im 48/63  bone]
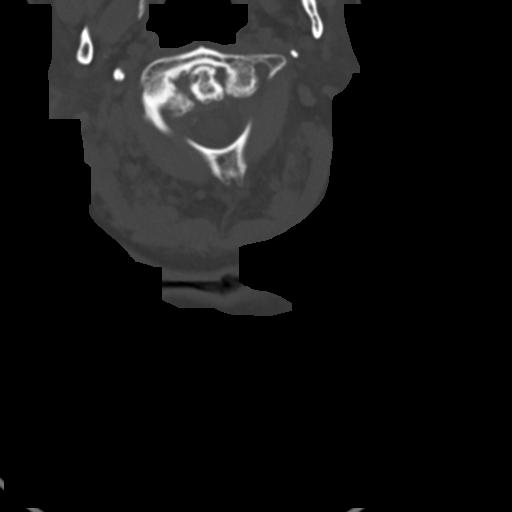
[im 58/63  bone]
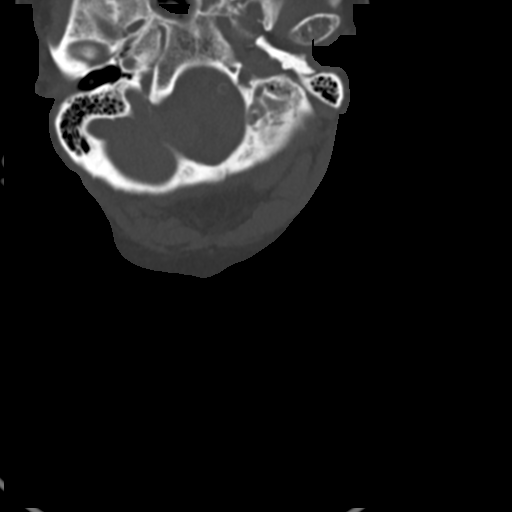

[Series 4: sagittal bone · sagittal · 0.20mm/px · 5 of 48 slices shown]
[im 8/48  bone]
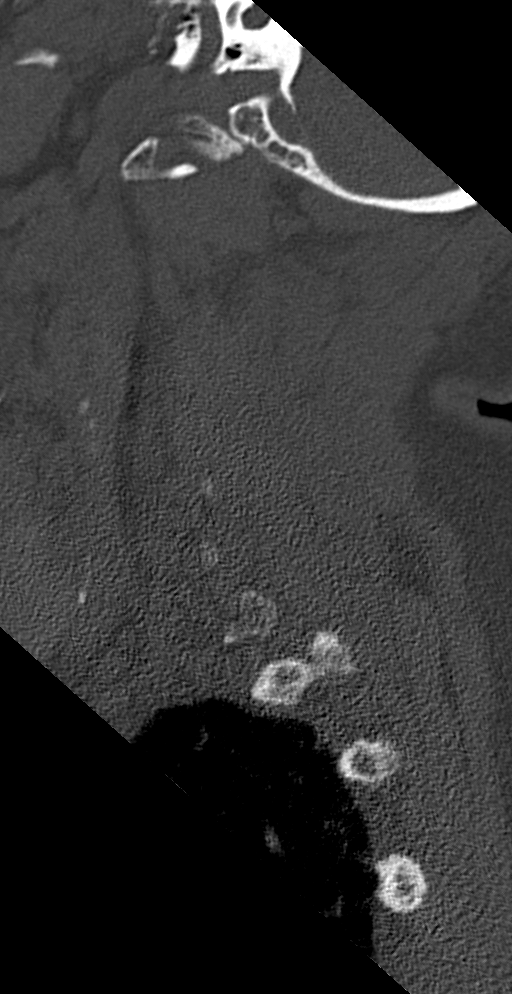
[im 16/48  bone]
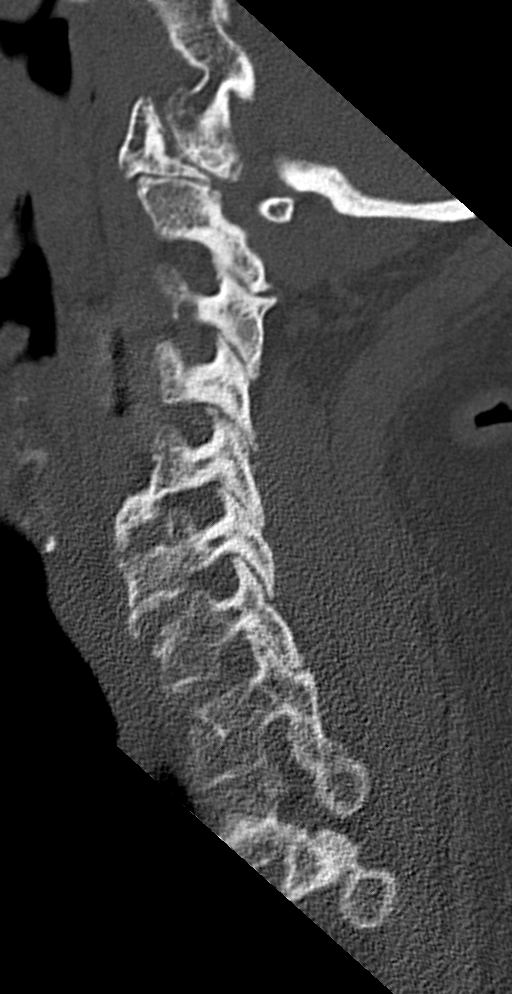
[im 24/48  bone]
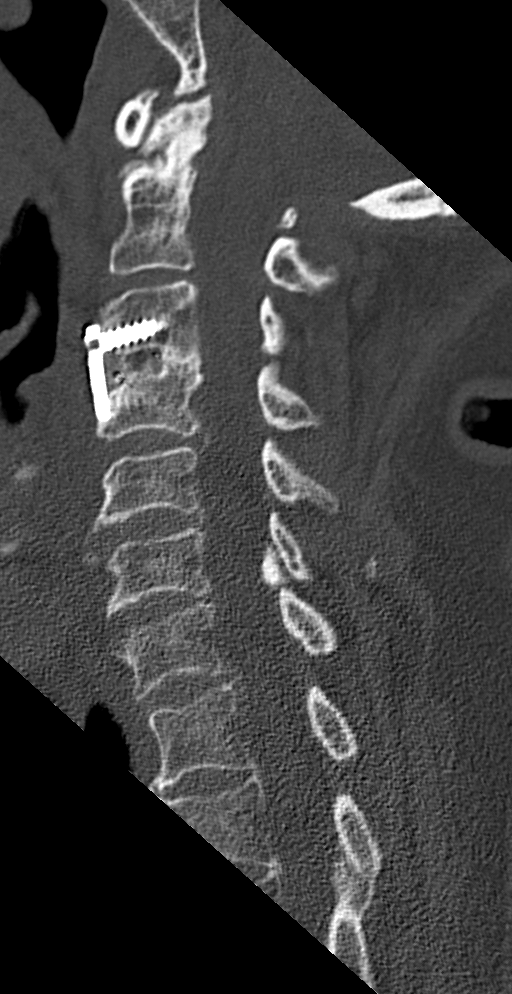
[im 32/48  bone]
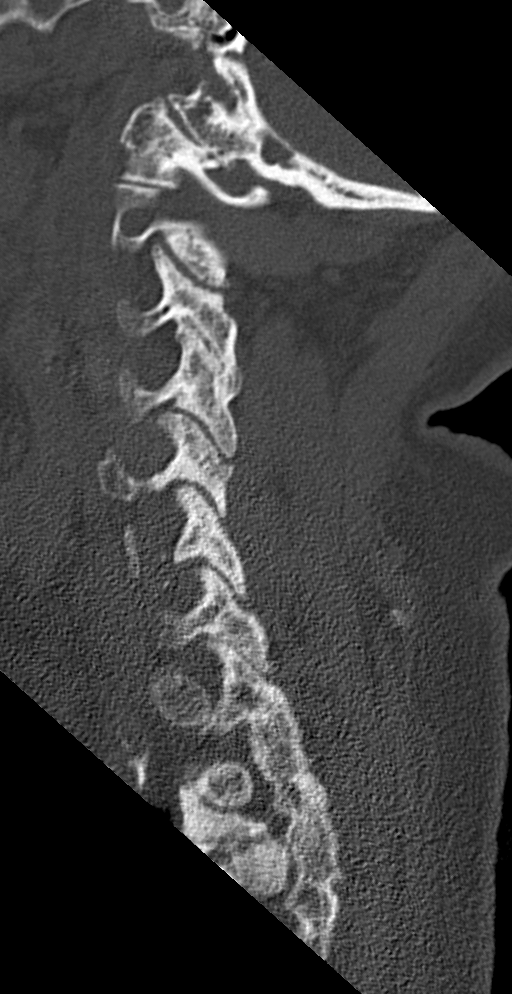
[im 40/48  bone]
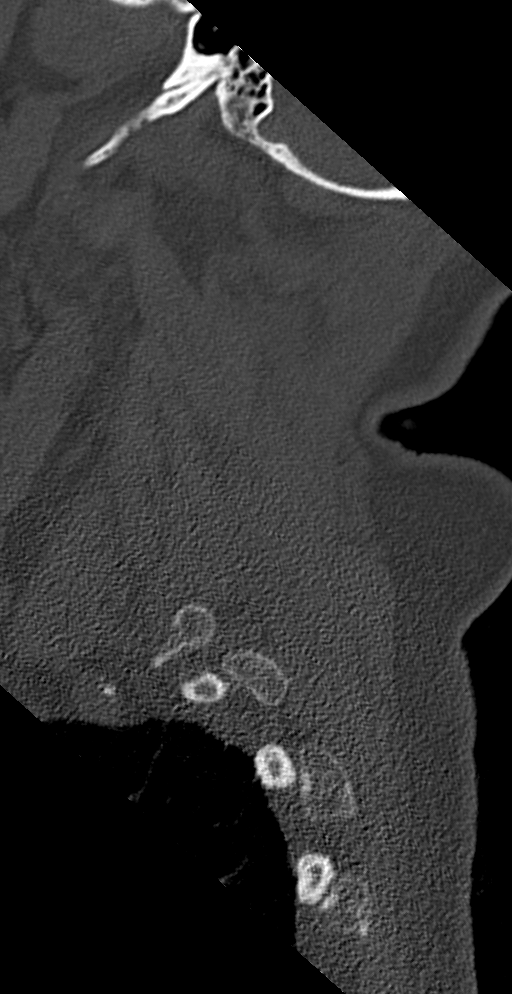

[13 of 27 positions shown; findings below may reference images not displayed]

FINDINGS: CT HEAD FINDINGS

Brain: No evidence of acute infarction, hemorrhage, hydrocephalus,
extra-axial collection or mass lesion/mass effect. Generalized
atrophy. Moderate remote right occipital infarct.

Vascular: No hyperdense vessel or unexpected calcification.

Skull: Forehead hematoma without calvarial fracture

Sinuses/Orbits: No evidence of injury. Left cataract resection and
chronic appearing sphenoid sinusitis with greater opacification on
the left.

CT CERVICAL SPINE FINDINGS

Alignment: No traumatic malalignment.

Skull base and vertebrae: Negative for acute fracture. C3-4 ACDF
with solid arthrodesis

Soft tissues and spinal canal: No prevertebral fluid or swelling. No
visible canal hematoma.

Disc levels: Generalized degenerative disease with notable
atlantooccipital degeneration with posterior subluxation.

Upper chest: Negative
IMPRESSION: 1. No evidence of acute intracranial or cervical spine injury.
2. Forehead hematoma without calvarial fracture.
3. Chronic and senescent changes described above.

## 2020-10-14 IMAGING — CT CT HEAD W/O CM
3 of 4 series · 15 of 47 positions shown, 18 images · non-contrast
Comparison: 05/27/2019

CLINICAL DATA: Unwitnessed fall with forehead laceration

EXAM:
CT HEAD WITHOUT CONTRAST
CT CERVICAL SPINE WITHOUT CONTRAST
TECHNIQUE: Multidetector CT imaging of the head and cervical spine was
performed following the standard protocol without intravenous
contrast. Multiplanar CT image reconstructions of the cervical spine
were also generated.

[Series 2: head wo · axial · 0.44mm/px · z∈[-96,+24]mm · 9 of 30 slices shown, 12 images]
[im 3/30  brain]
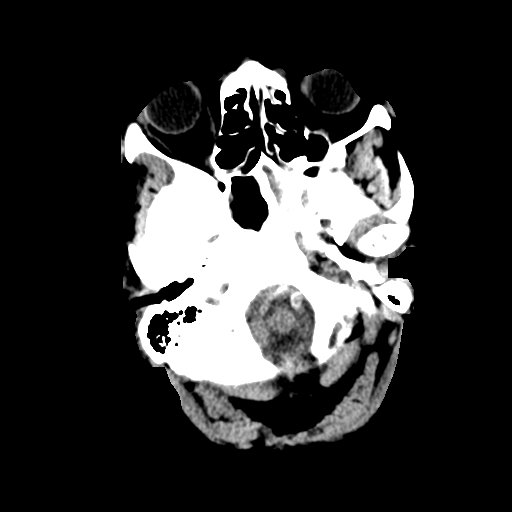
[im 3/30  bone]
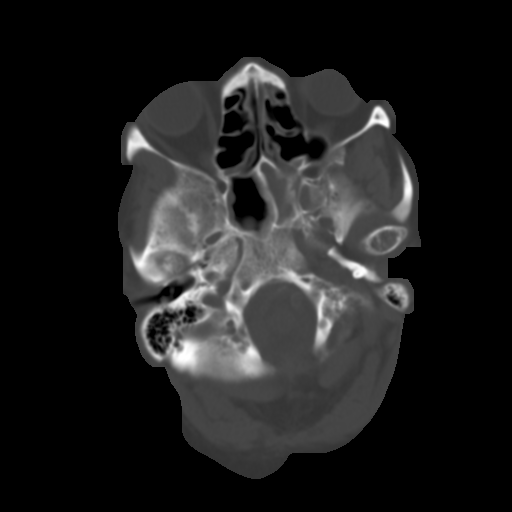
[im 7/30  brain]
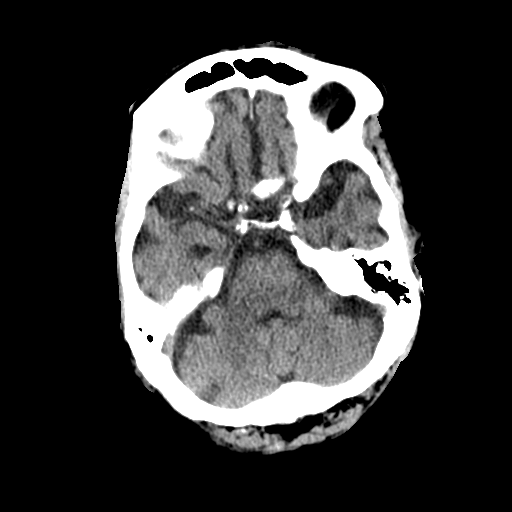
[im 9/30  brain]
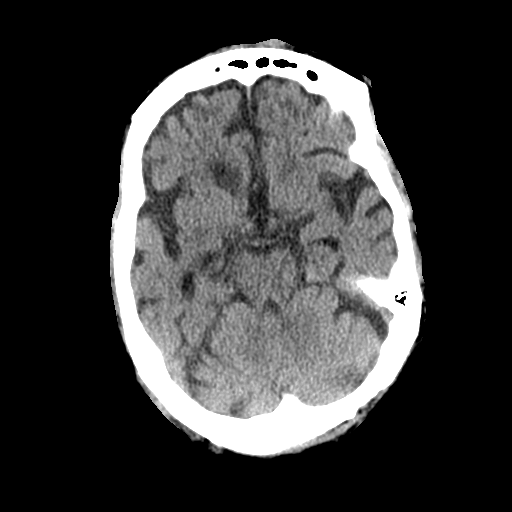
[im 13/30  brain]
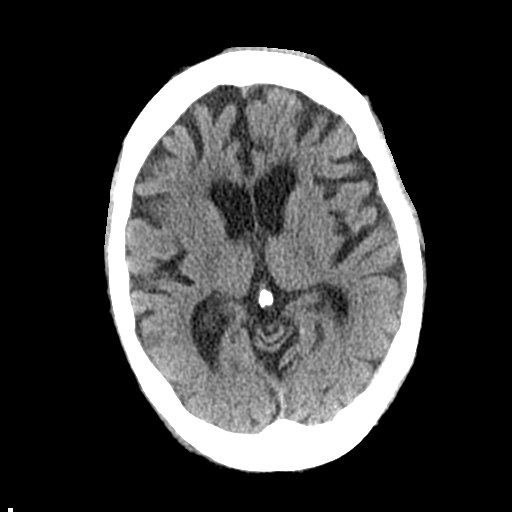
[im 15/30  brain]
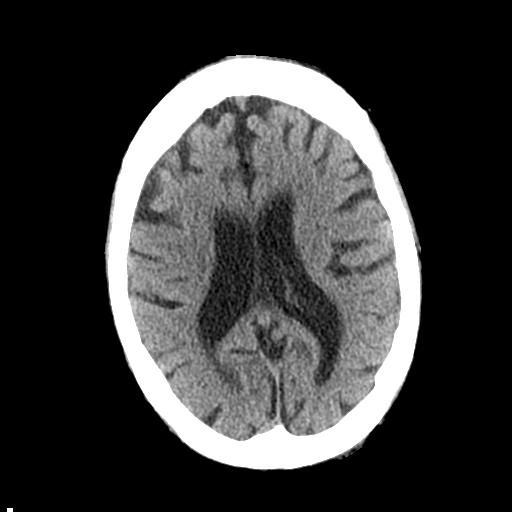
[im 15/30  bone]
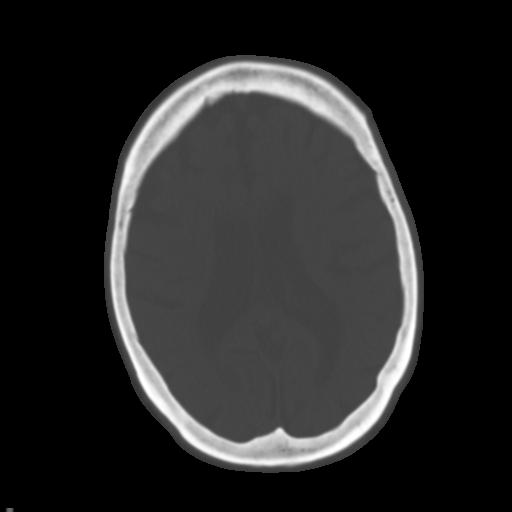
[im 17/30  brain]
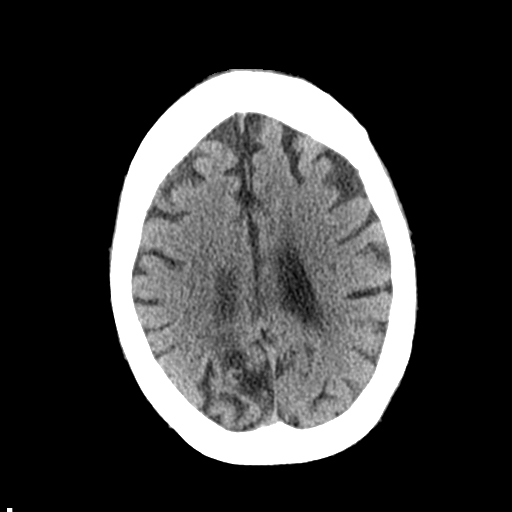
[im 21/30  brain]
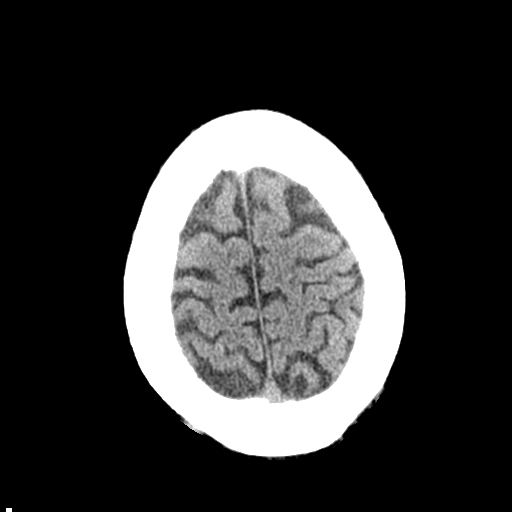
[im 23/30  brain]
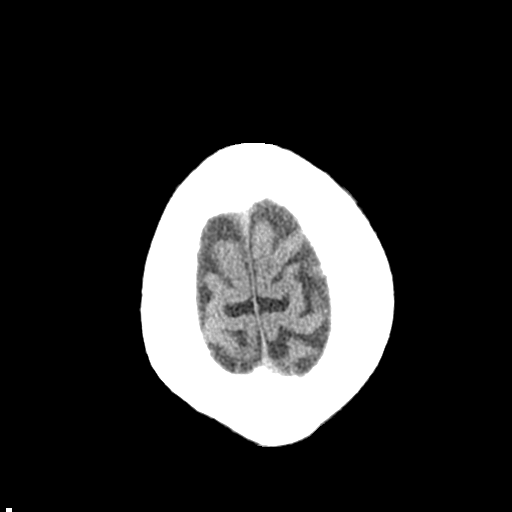
[im 27/30  brain]
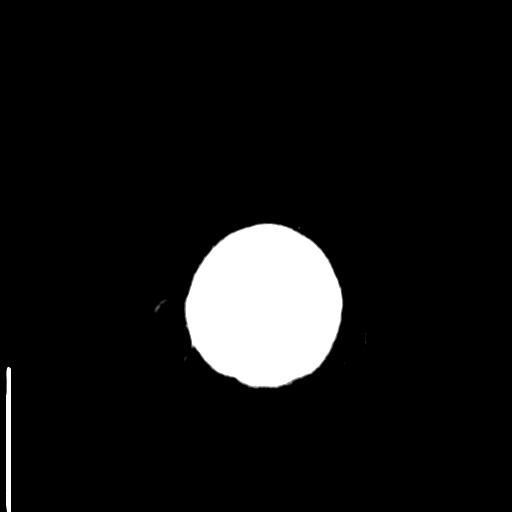
[im 27/30  bone]
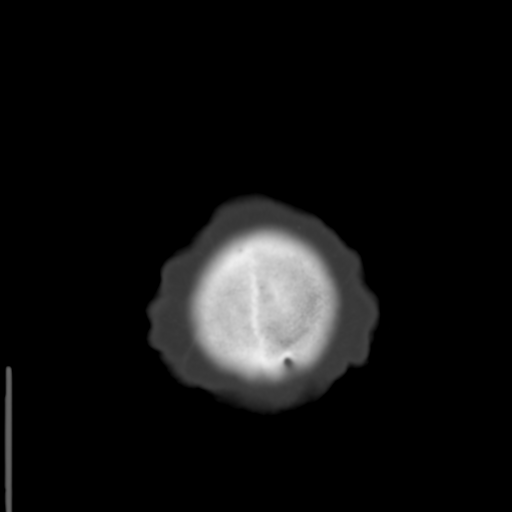

[Series 5: coronal soft tissue · coronal · 0.29mm/px · 3 of 65 slices shown]
[im 22/65  brain]
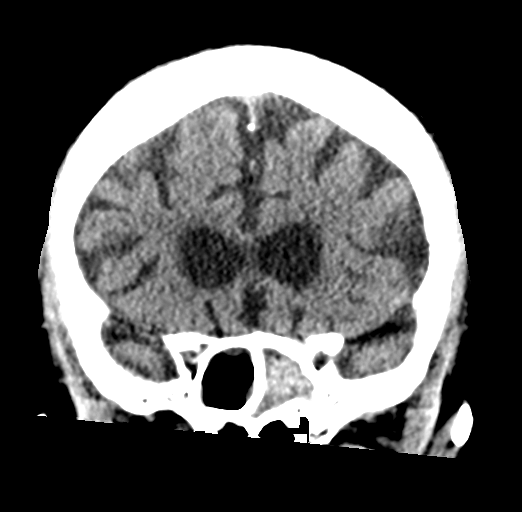
[im 29/65  brain]
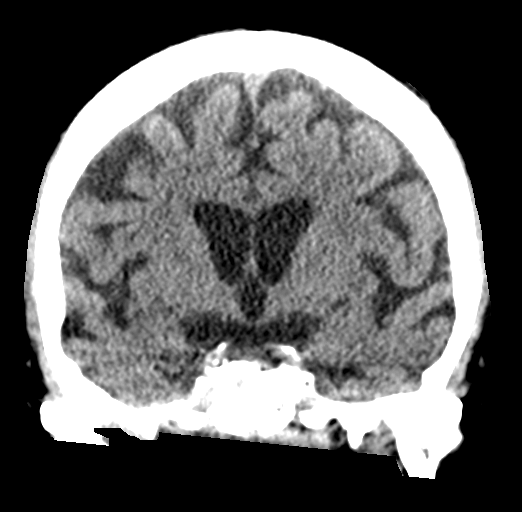
[im 36/65  brain]
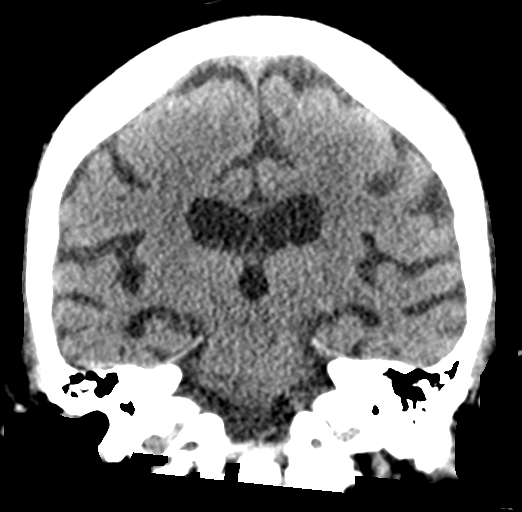

[Series 6: sagittal soft tissue · sagittal · 0.30mm/px · 3 of 47 slices shown]
[im 16/47  brain]
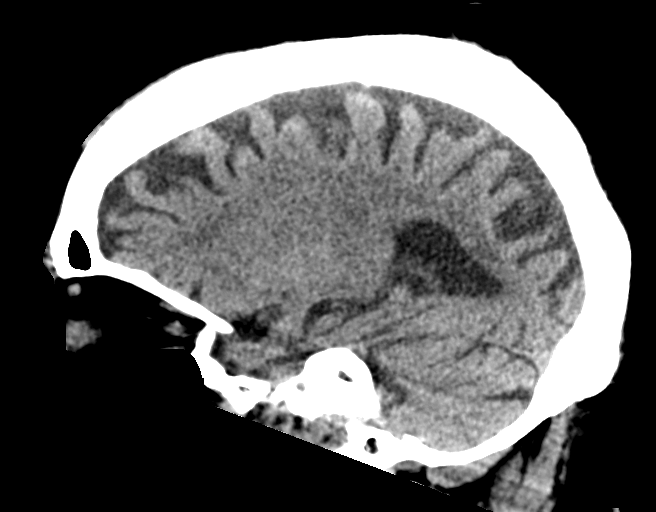
[im 24/47  brain]
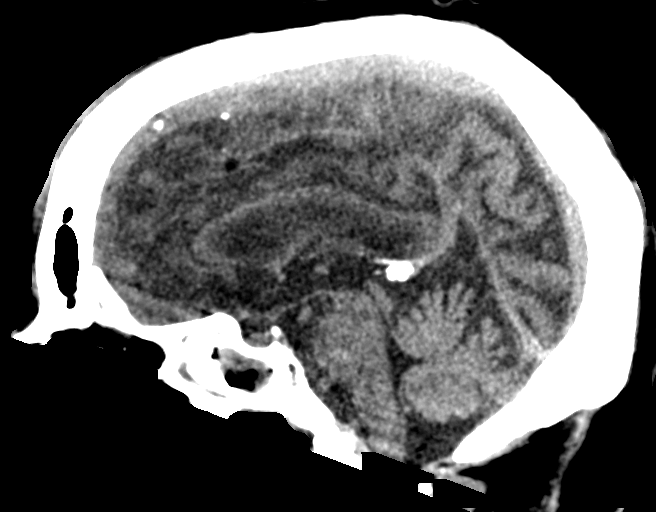
[im 31/47  brain]
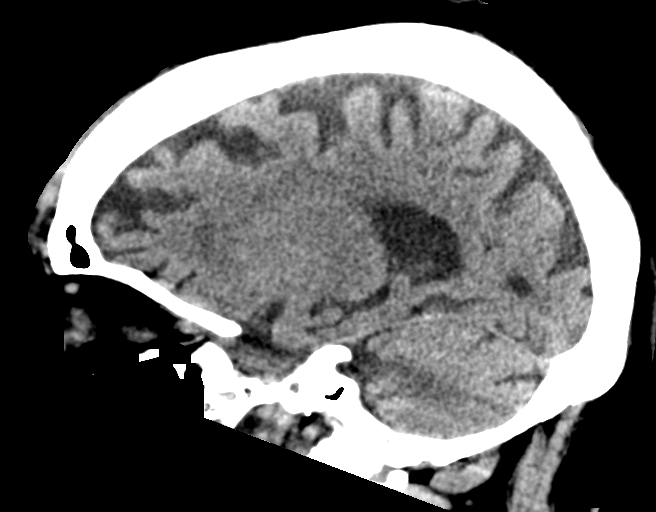

[15 of 47 positions shown; findings below may reference images not displayed]

FINDINGS: CT HEAD FINDINGS

Brain: No evidence of acute infarction, hemorrhage, hydrocephalus,
extra-axial collection or mass lesion/mass effect. Generalized
atrophy. Moderate remote right occipital infarct.

Vascular: No hyperdense vessel or unexpected calcification.

Skull: Forehead hematoma without calvarial fracture

Sinuses/Orbits: No evidence of injury. Left cataract resection and
chronic appearing sphenoid sinusitis with greater opacification on
the left.

CT CERVICAL SPINE FINDINGS

Alignment: No traumatic malalignment.

Skull base and vertebrae: Negative for acute fracture. C3-4 ACDF
with solid arthrodesis

Soft tissues and spinal canal: No prevertebral fluid or swelling. No
visible canal hematoma.

Disc levels: Generalized degenerative disease with notable
atlantooccipital degeneration with posterior subluxation.

Upper chest: Negative
IMPRESSION: 1. No evidence of acute intracranial or cervical spine injury.
2. Forehead hematoma without calvarial fracture.
3. Chronic and senescent changes described above.
